# Patient Record
Sex: Male | Born: 2000 | Race: Black or African American | Hispanic: No | Marital: Single | Smoking: Never smoker
Health system: Southern US, Community
[De-identification: ages and names within clinical notes are randomized; demographics above are authoritative.]

## PROBLEM LIST (undated history)

## (undated) DIAGNOSIS — T7840XA Allergy, unspecified, initial encounter: Secondary | ICD-10-CM

## (undated) DIAGNOSIS — F909 Attention-deficit hyperactivity disorder, unspecified type: Secondary | ICD-10-CM

## (undated) HISTORY — DX: Attention-deficit hyperactivity disorder, unspecified type: F90.9

## (undated) HISTORY — DX: Allergy, unspecified, initial encounter: T78.40XA

---
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
## 2022-08-23 ENCOUNTER — Encounter (HOSPITAL_COMMUNITY): Payer: Self-pay | Admitting: *Deleted

## 2022-08-23 ENCOUNTER — Emergency Department (HOSPITAL_COMMUNITY): Payer: Self-pay

## 2022-08-23 ENCOUNTER — Emergency Department (HOSPITAL_COMMUNITY)
Admission: EM | Admit: 2022-08-23 | Discharge: 2022-08-24 | Disposition: A | Discharge status: Home / Self Care | Payer: Self-pay | Attending: Emergency Medicine | Admitting: Emergency Medicine

## 2022-08-23 ENCOUNTER — Other Ambulatory Visit: Payer: Self-pay

## 2022-08-23 DIAGNOSIS — R079 Chest pain, unspecified: Secondary | ICD-10-CM | POA: Insufficient documentation

## 2022-08-23 LAB — CBC
HCT: 46.1 % (ref 39.0–52.0)
Hemoglobin: 16 g/dL (ref 13.0–17.0)
MCH: 30.2 pg (ref 26.0–34.0)
MCHC: 34.7 g/dL (ref 30.0–36.0)
MCV: 87.1 fL (ref 80.0–100.0)
Platelets: 236 10*3/uL (ref 150–400)
RBC: 5.29 MIL/uL (ref 4.22–5.81)
RDW: 12 % (ref 11.5–15.5)
WBC: 5.8 10*3/uL (ref 4.0–10.5)
nRBC: 0 % (ref 0.0–0.2)

## 2022-08-23 LAB — TROPONIN I (HIGH SENSITIVITY)
Troponin I (High Sensitivity): 3 ng/L (ref <18)
Troponin I (High Sensitivity): 4 ng/L (ref <18)

## 2022-08-23 LAB — BASIC METABOLIC PANEL
Anion gap: 9 (ref 5–15)
BUN: 17 mg/dL (ref 6–20)
CO2: 26 mmol/L (ref 22–32)
Calcium: 9.4 mg/dL (ref 8.9–10.3)
Chloride: 103 mmol/L (ref 98–111)
Creatinine, Ser: 1.31 mg/dL — ABNORMAL HIGH (ref 0.61–1.24)
GFR, Estimated: >60 mL/min (ref >60)
Glucose, Bld: 96 mg/dL (ref 70–99)
Potassium: 3.7 mmol/L (ref 3.5–5.1)
Sodium: 138 mmol/L (ref 135–145)

## 2022-08-23 NOTE — ED Triage Notes (Signed)
The pt is c/o lt upper chest pain since yesterday  worse today than yesterday  some sob none now

## 2022-08-24 NOTE — ED Provider Notes (Signed)
[place] [facility] AT [facility] Provider Note   CSN: [id number] Arrival date & time: 08/23/22  1842     History  Chief Complaint  Patient presents with   Chest Pain    Cole Morton is a 22 y.o. male.  22 year old male presents to the emergency department for evaluation of left-sided chest pain.  He reports sporadic sharp pains in his left chest which are nonradiating.  Present mostly when taking a deep breath as well as with movement.  He is sometimes able to isolate the area of his pain and aggravated with palpation as well.  Has noticed that the patient is more pronounced after eating, but does not note any increased belching, bloating.  No nausea, vomiting, diaphoresis, hemoptysis, leg swelling. No recent surgeries or hospitalizations.  Uses marijuana occasionally, denies other illicit drug use.  Drinks ETOH socially.   The history is provided by the patient. No language interpreter was used.  Chest Pain      Home Medications Prior to Admission medications   Medication Sig Start Date End Date Taking? Authorizing Provider  cetirizine (ZYRTEC) 10 MG tablet TAKE 1 TABLET BY MOUTH EVERY DAY 06/10/17   Myles Gip, DO  clotrimazole (LOTRIMIN) 1 % cream Apply 1 application topically 2 (two) times daily. 04/21/17   Myles Gip, DO  cyclobenzaprine (FLEXERIL) 10 MG tablet Take 1 tablet (10 mg total) by mouth 3 (three) times daily as needed for muscle spasms. 03/13/15   Georgiann Hahn, MD  fluticasone (FLONASE) 50 MCG/ACT nasal spray Place 2 sprays into both nostrils daily. 04/21/17   Myles Gip, DO  methylphenidate 27 MG PO CR tablet Take 1 tablet (27 mg total) by mouth daily with breakfast. 06/19/17 07/20/17  Myles Gip, DO      Allergies    Penicillins and Penicillins    Review of Systems   Review of Systems  Cardiovascular:  Positive for chest pain.  Ten systems reviewed and are negative for acute change, except as noted  in the HPI.    Physical Exam Updated Vital Signs BP 129/74 (BP Location: Right Arm)   Pulse 72   Temp 98.7 F (37.1 C) (Oral)   Resp 19   Ht 5\' 11"  (1.803 m)   Wt 100.6 kg   SpO2 100%   BMI 30.93 kg/m   Physical Exam Vitals and nursing note reviewed.  Constitutional:      General: He is not in acute distress.    Appearance: He is well-developed. He is not diaphoretic.  HENT:     Head: Normocephalic and atraumatic.  Eyes:     General: No scleral icterus.    Conjunctiva/sclera: Conjunctivae normal.  Cardiovascular:     Rate and Rhythm: Normal rate and regular rhythm.     Pulses: Normal pulses.  Pulmonary:     Effort: Pulmonary effort is normal. No respiratory distress.     Breath sounds: No stridor. No wheezing.     Comments: Lungs CTAB. Respirations even and unlabored. Musculoskeletal:        General: Normal range of motion.     Cervical back: Normal range of motion.     Comments: No BLE edema.  Skin:    General: Skin is warm and dry.     Coloration: Skin is not pale.     Findings: No erythema or rash.  Neurological:     Mental Status: He is alert and oriented to person, place, and time.  Coordination: Coordination normal.  Psychiatric:        Behavior: Behavior normal.     ED Results / Procedures / Treatments   Labs (all labs ordered are listed, but only abnormal results are displayed) Labs Reviewed  BASIC METABOLIC PANEL - Abnormal; Notable for the following components:      Result Value   Creatinine, Ser 1.31 (*)    All other components within normal limits  CBC  TROPONIN I (HIGH SENSITIVITY)  TROPONIN I (HIGH SENSITIVITY)    EKG EKG Interpretation Date/Time:  Friday August 23 2022 18:27:18 EDT Ventricular Rate:  94 PR Interval:  146 QRS Duration:  84 QT Interval:  328 QTC Calculation: 410 R Axis:   83  Text Interpretation: Normal sinus rhythm T wave abnormality, consider inferior ischemia Abnormal ECG No previous ECGs available Confirmed by  Glyn Ade [id number]) on 08/23/2022 6:55:58 PM  Radiology DG Chest 2 View  Result Date: 08/23/2022 CLINICAL DATA:  Chest pain short of breath EXAM: CHEST - 2 VIEW COMPARISON:  None Available. FINDINGS: The heart size and mediastinal contours are within normal limits. Both lungs are clear. The visualized skeletal structures are unremarkable. IMPRESSION: No active cardiopulmonary disease. Electronically Signed   By: Jasmine Pang M.D.   On: 08/23/2022 19:30    Procedures Procedures    Medications Ordered in ED Medications - No data to display  ED Course/ Medical Decision Making/ A&P Clinical Course as of 08/24/22 0050  Sat Aug 24, 2022  0001 EKG visualized. T waves appear c/w normal variant rather than ischemia. [KH]    Clinical Course User Index [KH] Antony Madura, PA-C                             Medical Decision Making  This patient presents to the ED for concern of chest pain, this involves an extensive number of treatment options, and is a complaint that carries with it a high risk of complications and morbidity.  The differential diagnosis includes costochondritis vs PUD/gastritis vs esophageal spasm vs pericarditis vs PNA vs PTX vs ACS.   Co morbidities that complicate the patient evaluation  ADHD   Additional history obtained:  Additional history obtained from significant other, at bedside External records from outside source obtained and reviewed including last pediatrician outpatient visit in 2019   Lab Tests:  I Ordered, and personally interpreted labs.  The pertinent results include:  Creatinine 1.31. No old for comparison. Labs otherwise normal.   Imaging Studies ordered:  I ordered imaging studies including CXR  I independently visualized and interpreted imaging which showed no acute cardiopulmonary abnormality. I agree with the radiologist interpretation   Cardiac Monitoring:  The patient was maintained on a cardiac monitor.  I personally viewed  and interpreted the cardiac monitored which showed an underlying rhythm of: NSR   Medicines ordered and prescription drug management:  I have reviewed the patients home medicines and have made adjustments as needed   Test Considered:  D dimer - however, low risk for PE. PERC negative   Problem List / ED Course:  Low suspicion for emergent cardiac etiology given reassuring workup today.  EKG is nonischemic and troponin negative x2.    Chest x-ray without evidence of mediastinal widening to suggest dissection.  No pneumothorax, pneumonia, pleural effusion.   Also doubt PE. PERC negative Reproducibility on palpation and aggravation with breathing/movement c/w costochondritis type picture. Discussed supportive care measures.   Reevaluation:  After the interventions noted above, I reevaluated the patient and found that they have :stayed the same   Social Determinants of Health:  Lives independently   Dispostion:  After consideration of the diagnostic results and the patients response to treatment, I feel that the patent would benefit from outpatient PCP follow up. Return precautions discussed and provided. Patient discharged in stable condition with no unaddressed concerns.          Final Clinical Impression(s) / ED Diagnoses Final diagnoses:  Nonspecific chest pain    Rx / DC Orders ED Discharge Orders     None         Antony Madura, PA-C 08/24/22 0058    Tilden Fossa, MD 08/24/22 [phone]

## 2022-08-24 NOTE — Discharge Instructions (Addendum)
Your cup in the ED today was reassuring.  We recommend follow-up with a primary care doctor.  If you find that your symptoms continue to be recurrent after eating, you may benefit from trialing over-the-counter Pepcid or Prilosec for 1 to 2 weeks to see if this alleviates your discomfort.  Return to the ED for new or concerning symptoms.

## 2022-12-06 ENCOUNTER — Ambulatory Visit (HOSPITAL_COMMUNITY)
Admission: RE | Admit: 2022-12-06 | Discharge: 2022-12-06 | Disposition: A | Discharge status: Home / Self Care | Payer: Self-pay | Source: Ambulatory Visit | Attending: Family Medicine | Admitting: Family Medicine

## 2022-12-06 ENCOUNTER — Encounter (HOSPITAL_COMMUNITY): Payer: Self-pay

## 2022-12-06 VITALS — BP 151/85 | HR 80 | Temp 99.0°F | Resp 18

## 2022-12-06 DIAGNOSIS — J069 Acute upper respiratory infection, unspecified: Secondary | ICD-10-CM

## 2022-12-06 MED ORDER — CETIRIZINE HCL 10 MG PO TABS: 10.0000 mg | ORAL_TABLET | Freq: Every day | ORAL | 0 refills | Status: AC

## 2022-12-06 MED ORDER — BENZONATATE 200 MG PO CAPS: 200.0000 mg | ORAL_CAPSULE | Freq: Three times a day (TID) | ORAL | 0 refills | Status: AC | PRN

## 2022-12-06 NOTE — ED Triage Notes (Signed)
Pt states he has had cough and sore throat x 2 days. He hasn't taken any meds states he usually just lets it pass.

## 2022-12-06 NOTE — Discharge Instructions (Signed)
You were seen today for upper respiratory symptoms.  Your symptoms appear viral at this time.  I have sent out a script for zyrtec daily, as well as tessalon to help with cough.  Please return if not improving or worsening.

## 2022-12-06 NOTE — ED Provider Notes (Signed)
[facility]    CSN: [id number] Arrival date & time: 12/06/22  1452      History   Chief Complaint Chief Complaint  Patient presents with   Cough    Entered by patient   Sore Throat    HPI Cole Morton is a 22 y.o. male.    Cough Associated symptoms: sore throat   Sore Throat  Patient is here for scratchy throat for several days.  A cough off/on.  No runny nose, congestion.  No fevers/chills.  No n/v.  No otc meds used.  No sick contacts.        Past Medical History:  Diagnosis Date   ADHD (attention deficit hyperactivity disorder)    Allergy     Patient Active Problem List   Diagnosis Date Noted   Tinea versicolor 04/21/2017   Seasonal allergic rhinitis 04/21/2017   Encounter for routine child health examination without abnormal findings 10/24/2016   BMI (body mass index), pediatric, 95-99% for age 20/13/2018   Back strain 09/18/2015   Attention deficit hyperactivity disorder (ADHD), combined type 04/10/2014    History reviewed. No pertinent surgical history.     Home Medications    Prior to Admission medications   Medication Sig Start Date End Date Taking? Authorizing Provider  cetirizine (ZYRTEC) 10 MG tablet TAKE 1 TABLET BY MOUTH EVERY DAY 06/10/17   Myles Gip, DO  clotrimazole (LOTRIMIN) 1 % cream Apply 1 application topically 2 (two) times daily. 04/21/17   Myles Gip, DO  cyclobenzaprine (FLEXERIL) 10 MG tablet Take 1 tablet (10 mg total) by mouth 3 (three) times daily as needed for muscle spasms. 03/13/15   Georgiann Hahn, MD  fluticasone (FLONASE) 50 MCG/ACT nasal spray Place 2 sprays into both nostrils daily. 04/21/17   Myles Gip, DO  methylphenidate 27 MG PO CR tablet Take 1 tablet (27 mg total) by mouth daily with breakfast. 06/19/17 07/20/17  Myles Gip, DO    Family History Family History  Problem Relation Age of Onset   Asthma Mother    Diabetes Mother    Arthritis Maternal  Grandmother    Cancer Maternal Grandmother         breast   Diabetes Maternal Grandmother    Diabetes Maternal Grandfather    Heart disease Maternal Grandfather    Hyperlipidemia Maternal Grandfather    Hypertension Maternal Grandfather    Alcohol abuse Neg Hx    Birth defects Neg Hx    COPD Neg Hx    Depression Neg Hx    Drug abuse Neg Hx    Early death Neg Hx    Hearing loss Neg Hx    Kidney disease Neg Hx    Learning disabilities Neg Hx    Mental illness Neg Hx    Mental retardation Neg Hx    Miscarriages / Stillbirths Neg Hx    Stroke Neg Hx    Vision loss Neg Hx    Varicose Veins Neg Hx     Social History Social History   Tobacco Use   Smoking status: Some Days    Types: Cigars   Smokeless tobacco: Never  Vaping Use   Vaping status: Some Days  Substance Use Topics   Alcohol use: Yes   Drug use: Never     Allergies   Penicillins   Review of Systems Review of Systems  Constitutional: Negative.   HENT:  Positive for sore throat.   Respiratory:  Positive for cough.  Cardiovascular: Negative.   Gastrointestinal: Negative.   Musculoskeletal: Negative.   Psychiatric/Behavioral: Negative.       Physical Exam Triage Vital Signs ED Triage Vitals  Encounter Vitals Group     BP 12/06/22 1508 (!) 151/85     Systolic BP Percentile --      Diastolic BP Percentile --      Pulse Rate 12/06/22 1508 80     Resp 12/06/22 1508 18     Temp 12/06/22 1508 99 F (37.2 C)     Temp Source 12/06/22 1508 Oral     SpO2 12/06/22 1508 95 %     Weight --      Height --      Head Circumference --      Peak Flow --      Pain Score 12/06/22 1506 0     Pain Loc --      Pain Education --      Exclude from Growth Chart --    No data found.  Updated Vital Signs BP (!) 151/85 (BP Location: Left Arm)   Pulse 80   Temp 99 F (37.2 C) (Oral)   Resp 18   SpO2 95%   Visual Acuity Right Eye Distance:   Left Eye Distance:   Bilateral Distance:    Right Eye Near:    Left Eye Near:    Bilateral Near:     Physical Exam Constitutional:      Appearance: He is well-developed.  HENT:     Mouth/Throat:     Mouth: Mucous membranes are moist.     Pharynx: Posterior oropharyngeal erythema present. No pharyngeal swelling, oropharyngeal exudate or uvula swelling.     Tonsils: No tonsillar exudate.  Cardiovascular:     Rate and Rhythm: Normal rate and regular rhythm.     Heart sounds: Normal heart sounds.  Pulmonary:     Effort: Pulmonary effort is normal.     Breath sounds: Normal breath sounds. No wheezing or rhonchi.  Musculoskeletal:     Cervical back: Normal range of motion and neck supple.  Lymphadenopathy:     Cervical: No cervical adenopathy.  Neurological:     General: No focal deficit present.     Mental Status: He is alert.  Psychiatric:        Mood and Affect: Mood normal.      UC Treatments / Results  Labs (all labs ordered are listed, but only abnormal results are displayed) Labs Reviewed - No data to display  EKG   Radiology No results found.  Procedures Procedures (including critical care time)  Medications Ordered in UC Medications - No data to display  Initial Impression / Assessment and Plan / UC Course  I have reviewed the triage vital signs and the nursing notes.  Pertinent labs & imaging results that were available during my care of the patient were reviewed by me and considered in my medical decision making (see chart for details).   Final Clinical Impressions(s) / UC Diagnoses   Final diagnoses:  Upper respiratory tract infection, unspecified type     Discharge Instructions      You were seen today for upper respiratory symptoms.  Your symptoms appear viral at this time.  I have sent out a script for zyrtec daily, as well as tessalon to help with cough.  Please return if not improving or worsening.     ED Prescriptions     Medication Sig Dispense Auth. Provider   cetirizine (ZYRTEC)  10 MG  tablet Take 1 tablet (10 mg total) by mouth daily. 30 tablet Elianne Gubser, MD   benzonatate (TESSALON) 200 MG capsule Take 1 capsule (200 mg total) by mouth 3 (three) times daily as needed for cough. 21 capsule Jannifer Franklin, MD      PDMP not reviewed this encounter.   Jannifer Franklin, MD 12/06/22 1520
